# Patient Record
Sex: Male | Born: 1950 | Race: White | Hispanic: Yes | Marital: Married | Smoking: Former smoker
Health system: Southern US, Community
[De-identification: ages and names within clinical notes are randomized; demographics above are authoritative.]

## PROBLEM LIST (undated history)

## (undated) HISTORY — PX: OTHER SURGICAL HISTORY: SHX169

---

## 2017-07-21 ENCOUNTER — Encounter (HOSPITAL_COMMUNITY): Payer: Self-pay

## 2017-07-21 ENCOUNTER — Emergency Department (HOSPITAL_COMMUNITY): Payer: No Typology Code available for payment source

## 2017-07-21 ENCOUNTER — Emergency Department (HOSPITAL_COMMUNITY)
Admission: EM | Admit: 2017-07-21 | Discharge: 2017-07-21 | Disposition: A | Discharge status: Home / Self Care | Payer: No Typology Code available for payment source | Attending: Emergency Medicine | Admitting: Emergency Medicine

## 2017-07-21 DIAGNOSIS — Y999 Unspecified external cause status: Secondary | ICD-10-CM | POA: Insufficient documentation

## 2017-07-21 DIAGNOSIS — S40012A Contusion of left shoulder, initial encounter: Secondary | ICD-10-CM | POA: Diagnosis not present

## 2017-07-21 DIAGNOSIS — Y92411 Interstate highway as the place of occurrence of the external cause: Secondary | ICD-10-CM | POA: Insufficient documentation

## 2017-07-21 DIAGNOSIS — Y9389 Activity, other specified: Secondary | ICD-10-CM | POA: Diagnosis not present

## 2017-07-21 DIAGNOSIS — N281 Cyst of kidney, acquired: Secondary | ICD-10-CM | POA: Diagnosis not present

## 2017-07-21 DIAGNOSIS — Z23 Encounter for immunization: Secondary | ICD-10-CM | POA: Diagnosis not present

## 2017-07-21 DIAGNOSIS — R079 Chest pain, unspecified: Secondary | ICD-10-CM

## 2017-07-21 DIAGNOSIS — S2222XA Fracture of body of sternum, initial encounter for closed fracture: Secondary | ICD-10-CM | POA: Insufficient documentation

## 2017-07-21 DIAGNOSIS — S0990XA Unspecified injury of head, initial encounter: Secondary | ICD-10-CM | POA: Diagnosis not present

## 2017-07-21 DIAGNOSIS — S299XXA Unspecified injury of thorax, initial encounter: Secondary | ICD-10-CM | POA: Diagnosis present

## 2017-07-21 LAB — COMPREHENSIVE METABOLIC PANEL
ALBUMIN: 4.1 g/dL (ref 3.5–5.0)
ALT: 46 U/L (ref 17–63)
ANION GAP: 10 (ref 5–15)
AST: 55 U/L — ABNORMAL HIGH (ref 15–41)
Alkaline Phosphatase: 51 U/L (ref 38–126)
BILIRUBIN TOTAL: 1.7 mg/dL — AB (ref 0.3–1.2)
BUN: 17 mg/dL (ref 6–20)
CO2: 26 mmol/L (ref 22–32)
Calcium: 9.3 mg/dL (ref 8.9–10.3)
Chloride: 106 mmol/L (ref 101–111)
Creatinine, Ser: 0.97 mg/dL (ref 0.61–1.24)
GFR calc Af Amer: >60 mL/min (ref >60)
GFR calc non Af Amer: >60 mL/min (ref >60)
GLUCOSE: 135 mg/dL — AB (ref 65–99)
POTASSIUM: 4.1 mmol/L (ref 3.5–5.1)
Sodium: 142 mmol/L (ref 135–145)
TOTAL PROTEIN: 6.9 g/dL (ref 6.5–8.1)

## 2017-07-21 LAB — CBC
HEMATOCRIT: 45.7 % (ref 39.0–52.0)
Hemoglobin: 15.6 g/dL (ref 13.0–17.0)
MCH: 28.6 pg (ref 26.0–34.0)
MCHC: 34.1 g/dL (ref 30.0–36.0)
MCV: 83.7 fL (ref 78.0–100.0)
Platelets: 235 10*3/uL (ref 150–400)
RBC: 5.46 MIL/uL (ref 4.22–5.81)
RDW: 13.5 % (ref 11.5–15.5)
WBC: 8.9 10*3/uL (ref 4.0–10.5)

## 2017-07-21 LAB — I-STAT TROPONIN, ED: TROPONIN I, POC: 0 ng/mL (ref 0.00–0.08)

## 2017-07-21 MED ORDER — OXYCODONE-ACETAMINOPHEN 5-325 MG PO TABS: 1.0000 | ORAL_TABLET | ORAL | 0 refills | Status: DC | PRN

## 2017-07-21 MED ORDER — MORPHINE SULFATE (PF) 4 MG/ML IV SOLN
2.0000 mg | Freq: Once | INTRAVENOUS | Status: AC
  Administered 2017-07-21: 2 mg via INTRAVENOUS
  Filled 2017-07-21: qty 1

## 2017-07-21 MED ORDER — METHOCARBAMOL 500 MG PO TABS: 500.0000 mg | ORAL_TABLET | Freq: Two times a day (BID) | ORAL | 0 refills | Status: DC

## 2017-07-21 MED ORDER — TETANUS-DIPHTH-ACELL PERTUSSIS 5-2.5-18.5 LF-MCG/0.5 IM SUSP
0.5000 mL | Freq: Once | INTRAMUSCULAR | Status: AC
  Administered 2017-07-21: 0.5 mL via INTRAMUSCULAR
  Filled 2017-07-21: qty 0.5

## 2017-07-21 MED ORDER — OXYCODONE-ACETAMINOPHEN 5-325 MG PO TABS
1.0000 | ORAL_TABLET | Freq: Once | ORAL | Status: AC
  Administered 2017-07-21: 1 via ORAL
  Filled 2017-07-21: qty 1

## 2017-07-21 MED ORDER — IOPAMIDOL (ISOVUE-300) INJECTION 61%
INTRAVENOUS | Status: AC
  Administered 2017-07-21: 100 mL
  Filled 2017-07-21: qty 100

## 2017-07-21 MED ORDER — SODIUM CHLORIDE 0.9 % IV BOLUS (SEPSIS)
500.0000 mL | Freq: Once | INTRAVENOUS | Status: AC
  Administered 2017-07-21: 500 mL via INTRAVENOUS
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
## 2017-07-21 NOTE — ED Notes (Signed)
Patient has returned back from CT 
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
## 2017-07-21 NOTE — ED Notes (Signed)
Incentive spirometer given to pt and educated on same
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
## 2017-07-21 NOTE — ED Notes (Signed)
Pt given urinal, pts wife called to bedside to help.
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
## 2017-07-21 NOTE — ED Notes (Signed)
Pt walks with steady gait but does c/o chest pain with moivment. Pt did wash hands remoiving glass before returning to bed.

## 2017-11-09 ENCOUNTER — Encounter: Payer: No Typology Code available for payment source | Admitting: Diagnostic Neuroimaging

## 2017-11-09 ENCOUNTER — Ambulatory Visit (INDEPENDENT_AMBULATORY_CARE_PROVIDER_SITE_OTHER): Payer: BLUE CROSS/BLUE SHIELD | Admitting: Diagnostic Neuroimaging

## 2017-11-09 ENCOUNTER — Encounter: Payer: BLUE CROSS/BLUE SHIELD | Admitting: Diagnostic Neuroimaging

## 2017-11-09 DIAGNOSIS — M79605 Pain in left leg: Secondary | ICD-10-CM | POA: Diagnosis not present

## 2017-11-09 DIAGNOSIS — M79604 Pain in right leg: Secondary | ICD-10-CM

## 2017-11-09 DIAGNOSIS — Z0289 Encounter for other administrative examinations: Secondary | ICD-10-CM

## 2017-11-10 NOTE — Procedures (Signed)
[facility]  NCS (NERVE CONDUCTION STUDY) WITH EMG (ELECTROMYOGRAPHY) REPORT   STUDY DATE: 11/09/17 PATIENT NAME: Joshua Wells DOB: 1951-10-16 MRN: [id number]  ORDERING CLINICIAN: Darryl LentAmanda Taylor, PA  TECHNOLOGIST: Charlesetta IvoryBeau Handy ELECTROMYOGRAPHER: Glenford BayleyVikram R. Sruti Ayllon, MD  CLINICAL INFORMATION: 66 year old male with lower extremity pain. Patient has lower extremity weakness with prolonged standing, which is relieved by sitting. Patient has severe spinal stenosis at L4-5 and L5-S1 on CT lumbar spine from July 2018.   FINDINGS: NERVE CONDUCTION STUDY: Right peroneal and bilateral tibial motor responses have normal distal latencies, decreased amplitude, normal conduction velocity and right peroneal nerve, mildly slow conduction velocities in the bilateral tibial nerves.  Left peroneal motor response is normal.  Bilateral tibial F wave latencies are slightly prolonged.  Bilateral sural and superficial peroneal sensory responses are normal.   NEEDLE ELECTROMYOGRAPHY: Needle examination of bilateral lower extremities shows decreased motor unit recruitment in right tibialis anterior, right gastrocnemius and left gastrocnemius muscles on exertion.  Positive sharp waves and fibrillation potentials noted in right gastrocnemius at rest.  Right vastus medialis is normal.  Bilateral lumbar paraspinal muscles are normal.   IMPRESSION:   Abnormal study demonstrating: - Electrodiagnostic evidence of right L5 and bilateral S1 radiculopathies (even though lumbar paraspinal muscles are unremarkable on needle EMG).   - Given the clinical context and CT lumbar spine findings from July 2018, patient's symptoms and electrodiagnostic findings are likely related to severe spinal stenosis at L4-5 and L5-S1.     INTERPRETING PHYSICIAN:  Suanne MarkerVIKRAM R. Rosaura Bolon, MD Certified in Neurology, Neurophysiology and Neuroimaging  [facility] [address], [address] [place], [place] [address] [phone]   [facility]    Nerve / Sites Muscle Latency Ref. Amplitude Ref. Rel Amp Segments Distance Velocity Ref. Area    ms ms mV mV %  cm m/s m/s mVms  R Peroneal - EDB     Ankle EDB 5.6 ?6.5 1.6 ?2.0 100 Ankle - EDB 9   4.0     Fib head EDB 12.0  1.4  85.9 Fib head - Ankle 28 44 ?44 3.8     Pop fossa EDB 14.3  0.8  56.1 Pop fossa - Fib head 10 44 ?44 2.4         Pop fossa - Ankle      L Peroneal - EDB     Ankle EDB 4.5 ?6.5 5.4 ?2.0 100 Ankle - EDB 9   16.4     Fib head EDB 10.2  4.2  78.3 Fib head - Ankle 27 48 ?44 14.1     Pop fossa EDB 12.4  4.0  94.2 Pop fossa - Fib head 10 45 ?44 13.2         Pop fossa - Ankle      R Tibial - AH     Ankle AH 4.2 ?5.8 3.8 ?4.0 100 Ankle - AH 9   7.8     Pop fossa AH 13.1  1.4  36.7 Pop fossa - Ankle 33 37 ?41 3.8  L Tibial - AH     Ankle AH 4.7 ?5.8 1.5 ?4.0 100 Ankle - AH 9   4.0     Pop fossa AH 13.9  2.0  128 Pop fossa - Ankle 33 36 ?41 4.4             SNC    Nerve / Sites Rec. Site Peak Lat Ref.  Amp Ref. Segments Distance    ms ms  V V  cm  R Sural - Ankle (Calf)     Calf Ankle 3.3 ?4.4 12 ?6 Calf - Ankle 14  L Sural - Ankle (Calf)     Calf Ankle 3.1 ?4.4 11 ?6 Calf - Ankle 14  R Superficial peroneal - Ankle     Lat leg Ankle 3.2 ?4.4 7 ?6 Lat leg - Ankle 14  L Superficial peroneal - Ankle     Lat leg Ankle 3.4 ?4.4 9 ?6 Lat leg - Ankle 14             F  Wave    Nerve F Lat Ref.   ms ms  R Tibial - AH 62.5 ?56.0  L Tibial - AH 58.0 ?56.0         EMG full       EMG Summary Table    Spontaneous MUAP Recruitment  Muscle IA Fib PSW Fasc Other Amp Dur. Poly Pattern  R. Vastus medialis Normal None None None _______ Normal Normal Normal Normal  R. Tibialis anterior Normal None None None _______ Normal Normal Normal Reduced  R. Gastrocnemius (Medial head) Normal 2+ 2+ None _______ Increased Normal Normal Reduced  L. Tibialis anterior Normal None None None _______ Normal Normal Normal Normal  L.  Gastrocnemius (Medial head) Increased None None None _______ Normal Normal Normal Reduced  R. Lumbar paraspinals Normal None None None _______ Normal Normal Normal Normal
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
## 2024-02-01 LAB — HM AWV

## 2024-02-01 LAB — PROTEIN / CREATININE RATIO, URINE
Albumin, U: 62.8
Creatinine, Urine: 97.5

## 2024-02-01 LAB — MICROALBUMIN / CREATININE URINE RATIO: Microalb Creat Ratio: 64

## 2024-07-19 LAB — COMPREHENSIVE METABOLIC PANEL WITH GFR
Albumin: 4.9 (ref 3.5–5.0)
Calcium: 9.3 (ref 8.7–10.7)
Globulin: 2.4
eGFR: 128

## 2024-07-19 LAB — LIPID PANEL
Cholesterol: 138 (ref 0–200)
HDL: 52 (ref 35–70)
LDL Cholesterol: 66
LDl/HDL Ratio: 20
Triglycerides: 98 (ref 40–160)

## 2024-07-19 LAB — BASIC METABOLIC PANEL WITH GFR
BUN: 14 (ref 4–21)
CO2: 27 — AB (ref 13–22)
Chloride: 100 (ref 99–108)
Creatinine: 0.6 (ref 0.6–1.3)
Glucose: 169
Potassium: 4 meq/L (ref 3.5–5.1)
Sodium: 138 (ref 137–147)

## 2024-11-13 ENCOUNTER — Ambulatory Visit (INDEPENDENT_AMBULATORY_CARE_PROVIDER_SITE_OTHER): Payer: Self-pay | Admitting: Student

## 2024-11-13 ENCOUNTER — Other Ambulatory Visit (HOSPITAL_BASED_OUTPATIENT_CLINIC_OR_DEPARTMENT_OTHER): Payer: Self-pay

## 2024-11-13 ENCOUNTER — Encounter (HOSPITAL_BASED_OUTPATIENT_CLINIC_OR_DEPARTMENT_OTHER): Payer: Self-pay | Admitting: Student

## 2024-11-13 VITALS — BP 162/61 | HR 76 | Temp 98.5°F | Resp 16 | Ht 63.19 in | Wt 197.9 lb

## 2024-11-13 DIAGNOSIS — Z7689 Persons encountering health services in other specified circumstances: Secondary | ICD-10-CM | POA: Diagnosis not present

## 2024-11-13 DIAGNOSIS — R030 Elevated blood-pressure reading, without diagnosis of hypertension: Secondary | ICD-10-CM

## 2024-11-13 DIAGNOSIS — S46212A Strain of muscle, fascia and tendon of other parts of biceps, left arm, initial encounter: Secondary | ICD-10-CM | POA: Diagnosis not present

## 2024-11-13 DIAGNOSIS — E119 Type 2 diabetes mellitus without complications: Secondary | ICD-10-CM | POA: Insufficient documentation

## 2024-11-13 MED ORDER — MELOXICAM 15 MG PO TABS
15.0000 mg | ORAL_TABLET | Freq: Every day | ORAL | 0 refills | Status: AC
  Filled 2024-11-13 (×2): qty 30, 30d supply, fill #0

## 2024-11-13 NOTE — Patient Instructions (Addendum)
 It was nice to see you today!   Immobilize the injured arm in a sling with the elbow at 90 degrees of flexion.[1-2]  Apply ice to the affected area and prescribe oral analgesics for pain management.[1-2]  If you have any problems before your next visit feel free to message me via MyChart (minor issues or questions) or call the office, otherwise you may reach out to schedule an office visit.  Thank you! Myriah Boggus, PA-C
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
## 2024-11-14 ENCOUNTER — Other Ambulatory Visit (HOSPITAL_BASED_OUTPATIENT_CLINIC_OR_DEPARTMENT_OTHER): Payer: Self-pay

## 2024-11-19 ENCOUNTER — Encounter (HOSPITAL_BASED_OUTPATIENT_CLINIC_OR_DEPARTMENT_OTHER): Payer: Self-pay

## 2024-11-19 ENCOUNTER — Telehealth (HOSPITAL_BASED_OUTPATIENT_CLINIC_OR_DEPARTMENT_OTHER): Payer: Self-pay

## 2024-11-19 NOTE — Telephone Encounter (Signed)
 Called pt to advised we received a message from [facility] asking him to call them back at [phone]. Pt stated he already has an appt at the [facility] location on 11/25/2024. Number was fixed on his chart. We had a number off from what he wrote on DPR.

## 2024-12-25 ENCOUNTER — Ambulatory Visit (HOSPITAL_BASED_OUTPATIENT_CLINIC_OR_DEPARTMENT_OTHER): Admitting: Student

## 2024-12-25 ENCOUNTER — Encounter (HOSPITAL_BASED_OUTPATIENT_CLINIC_OR_DEPARTMENT_OTHER): Payer: Self-pay

## 2024-12-25 ENCOUNTER — Encounter (HOSPITAL_BASED_OUTPATIENT_CLINIC_OR_DEPARTMENT_OTHER): Payer: Self-pay | Admitting: Student

## 2024-12-25 VITALS — BP 168/88 | HR 66 | Temp 97.6°F | Resp 16 | Ht 63.19 in | Wt 195.8 lb

## 2024-12-25 DIAGNOSIS — E119 Type 2 diabetes mellitus without complications: Secondary | ICD-10-CM | POA: Diagnosis not present

## 2024-12-25 DIAGNOSIS — Z136 Encounter for screening for cardiovascular disorders: Secondary | ICD-10-CM | POA: Diagnosis not present

## 2024-12-25 DIAGNOSIS — M5416 Radiculopathy, lumbar region: Secondary | ICD-10-CM | POA: Diagnosis not present

## 2024-12-25 DIAGNOSIS — R030 Elevated blood-pressure reading, without diagnosis of hypertension: Secondary | ICD-10-CM

## 2024-12-25 DIAGNOSIS — Z13220 Encounter for screening for lipoid disorders: Secondary | ICD-10-CM | POA: Diagnosis not present

## 2024-12-25 DIAGNOSIS — Z0000 Encounter for general adult medical examination without abnormal findings: Secondary | ICD-10-CM | POA: Diagnosis not present

## 2024-12-25 NOTE — Patient Instructions (Signed)
 It was nice to see you today!  As we discussed in clinic:  Things to do to keep yourself healthy! - Exercise at least 30-45 minutes a day, 3-4 days a week.  - Eat a low-fat diet with lots of fruits and vegetables, up to 7-9 servings per day.  - Seatbelts can save your life. Wear them always.  - Smoke detectors on every level of your home, check batteries every year.  - Eye Doctor: have an eye exam every 1-2 years, even if you do not wear glasses or contacts. - Safe sex: if you may be exposed to STDs, use a condom.  - Alcohol: If you drink, do it moderately, less than 2 drinks per day.  - Health Care Power of Attorney: choose someone to speak for you if you are not able.  - Depression and anxiety are common in our stressful world.If you're feeling stressed, down, or like you're losing interest in things you normally enjoy, please come in for a visit.  - Violence: If anyone is threatening or hurting you, please call immediately.  Everyone deserves to be safe and loved in all of the relationships.   If you have any problems before your next visit feel free to message me via MyChart (minor issues or questions) or call the office, otherwise you may reach out to schedule an office visit.  Thank you! Natasha Burda, PA-C

## 2024-12-25 NOTE — Progress Notes (Signed)
 "  Complete physical exam  Patient: Joshua Wells   DOB: 10/11/1951   73 y.o. Male  MRN: [id number]  Subjective:    Chief Complaint  Patient presents with   Annual Exam    Annual exam.    Leg Pain    Left pain has been having needle-like pain. It started yesterday. Tingling from left hip down to leg.   Medical Management of Chronic Issues    Follow up. Still having left shoulder pain even when holding light things like ice cream. Has to do exercises and pain eases off after shoulder joint pops. Had some X-rays this month with PT. Has not heard back.    Discussed the use of AI scribe software for clinical note transcription with the patient, who gave verbal consent to proceed.  History of Present Illness   Joshua Wells is a 73 year old male who presents for an annual physical exam.  He experiences ongoing shoulder pain, particularly when holding objects like ice cream, which exacerbates the discomfort. Rotating his arm until it pops provides some relief. He has not been prescribed any medication specifically for this issue but has been using meloxicam , which he finds not very effective.  He experiences low back pain, especially when in bed, with nerve pain radiating down his leg, described as 'electric' in nature. Stretching exercises provide some relief.  He has lost weight211 pounds to 195 pounds, attributed to dietary changes, including reduced red meat consumption and increased intake of beans and salads. He avoids flour-based foods and drinks only water and coffee.  He has a family history of ALS, with a sister who passed away from the disease and a brother currently affected. He also notes a niece with severe diabetes leading to blindness.  He has not visited a dentist recently due to cost concerns but acknowledges the need for a dental check-up. He received one shingles vaccine in 2019 but has not completed the series and has not received a pneumonia vaccine.      Most  recent fall risk assessment:    12/25/2024    9:13 AM  Fall Risk   Falls in the past year? 0  Number falls in past yr: 0  Injury with Fall? 0  Risk for fall due to : No Fall Risks  Follow up Falls evaluation completed;Education provided;Falls prevention discussed     Most recent depression screenings:    11/13/2024    1:46 PM  PHQ 2/9 Scores  PHQ - 2 Score 0  PHQ- 9 Score 4   Patient Active Problem List   Diagnosis Date Noted   Type 2 diabetes mellitus without complication, without long-term current use of insulin (HCC) 11/13/2024   History reviewed. No pertinent past medical history. Social History[1] Allergies[2]    Patient Care Team: Kc Sedlak T, PA-C as PCP - General (Physician Assistant)   Show/hide medication list[3]  ROS  Per HPI     Objective:     BP (!) 168/88 (BP Location: Right Arm, Patient Position: Sitting, Cuff Size: Normal)   Pulse 66   Temp 97.6 F (36.4 C) (Oral)   Resp 16   Ht 5' 3.19 (1.605 m)   Wt 195 lb 12.8 oz (88.8 kg)   SpO2 95%   BMI 34.48 kg/m  BP Readings from Last 3 Encounters:  12/25/24 (!) 168/88  11/13/24 (!) 162/61  07/21/17 (!) 144/87   Wt Readings from Last 3 Encounters:  12/25/24 195 lb 12.8 oz (88.8 kg)  11/13/24 197 lb 14.4 oz (89.8 kg)  07/21/17 200 lb (90.7 kg)   SpO2 Readings from Last 3 Encounters:  12/25/24 95%  11/13/24 95%  07/21/17 92%      Physical Exam Constitutional:      General: He is not in acute distress.    Appearance: Normal appearance. He is not ill-appearing or diaphoretic.  HENT:     Head: Normocephalic and atraumatic.     Right Ear: Tympanic membrane, ear canal and external ear normal.     Left Ear: Tympanic membrane, ear canal and external ear normal.     Nose: Nose normal.     Mouth/Throat:     Mouth: Mucous membranes are moist.     Pharynx: Oropharynx is clear.  Eyes:     General: No scleral icterus.       Right eye: No discharge.        Left eye: No discharge.      Extraocular Movements: Extraocular movements intact.     Conjunctiva/sclera: Conjunctivae normal.     Pupils: Pupils are equal, round, and reactive to light.  Neck:     Thyroid: No thyroid mass, thyromegaly or thyroid tenderness.     Vascular: No carotid bruit.  Cardiovascular:     Rate and Rhythm: Normal rate and regular rhythm.     Pulses: Normal pulses.          Dorsalis pedis pulses are 2+ on the right side and 2+ on the left side.       Posterior tibial pulses are 2+ on the right side and 2+ on the left side.     Heart sounds: Normal heart sounds. No murmur heard.    No friction rub. No gallop.  Pulmonary:     Effort: Pulmonary effort is normal. No respiratory distress.     Breath sounds: Normal breath sounds. No wheezing, rhonchi or rales.  Chest:     Chest wall: No tenderness.  Abdominal:     General: Bowel sounds are normal. There is no distension.     Palpations: Abdomen is soft.     Tenderness: There is no abdominal tenderness. There is no guarding.  Musculoskeletal:        General: No swelling, deformity or signs of injury. Normal range of motion.     Cervical back: Neck supple.     Right lower leg: No edema.     Left lower leg: No edema.     Right foot: No Charcot foot.     Left foot: No Charcot foot.     Comments: SLR positive on BLLE.  Feet:     Right foot:     Protective Sensation: 9 sites tested.  7 sites sensed.     Skin integrity: Dry skin present.     Left foot:     Protective Sensation: 9 sites tested.  6 sites sensed.     Skin integrity: Dry skin present.     Toenail Condition: Left toenails are abnormally thick.  Lymphadenopathy:     Cervical: No cervical adenopathy.     Right cervical: No superficial or posterior cervical adenopathy.    Left cervical: No superficial cervical adenopathy.  Skin:    General: Skin is warm and dry.     Coloration: Skin is not jaundiced or pale.     Findings: No rash.  Neurological:     General: No focal deficit  present.     Mental Status: He is alert and oriented to  person, place, and time.     Motor: No weakness.  Psychiatric:        Mood and Affect: Mood normal.        Behavior: Behavior normal.      No results found for any visits on 12/25/24. Last CBC Lab Results  Component Value Date   WBC 8.9 07/21/2017   HGB 15.6 07/21/2017   HCT 45.7 07/21/2017   MCV 83.7 07/21/2017   MCH 28.6 07/21/2017   RDW 13.5 07/21/2017   PLT 235 07/21/2017   Last metabolic panel Lab Results  Component Value Date   GLUCOSE 135 (H) 07/21/2017   NA 138 07/19/2024   K 4.0 07/19/2024   CL 100 07/19/2024   CO2 27 (A) 07/19/2024   BUN 14 07/19/2024   CREATININE 0.6 07/19/2024   EGFR 128 07/19/2024   CALCIUM 9.3 07/19/2024   PROT 6.9 07/21/2017   ALBUMIN 4.9 07/19/2024   BILITOT 1.7 (H) 07/21/2017   ALKPHOS 51 07/21/2017   AST 55 (H) 07/21/2017   ALT 46 07/21/2017   ANIONGAP 10 07/21/2017   Last lipids Lab Results  Component Value Date   CHOL 138 07/19/2024   HDL 52 07/19/2024   LDLCALC 66 07/19/2024   TRIG 98 07/19/2024   Last hemoglobin A1c No results found for: HGBA1C      Assessment & Plan:    Routine Health Maintenance and Physical Exam  Health Maintenance  Topic Date Due   Hemoglobin A1C  Never done   Eye exam for diabetics  Never done   Hepatitis C Screening  Never done   Pneumococcal Vaccine for age over 31 (1 of 2 - PCV) Never done   Colon Cancer Screening  Never done   Zoster (Shingles) Vaccine (2 of 2) 04/03/2018   Medicare Annual Wellness Visit  01/31/2025   COVID-19 Vaccine (2 - 2025-26 season) 01/10/2025*   Flu Shot  03/25/2025*   Yearly kidney health urinalysis for diabetes  01/31/2025   Yearly kidney function blood test for diabetes  07/19/2025   Complete foot exam   12/25/2025   DTaP/Tdap/Td vaccine (2 - Td or Tdap) 07/22/2027   Meningitis B Vaccine  Aged Out  *Topic was postponed. The date shown is not the original due date.   Assessment and Plan     Annual Exam Due for shingles and pneumonia vaccines. Last colonoscopy was 3-4 years ago with a 10-year follow-up recommendation. No recent dental visit. - Recommended pneumonia and shingles vaccine at pharmacy- denies today. - Schedule dental visit for cleaning. - Ensure colonoscopy follow-up as per previous recommendation.   Type 2 diabetes mellitus Recent weight loss from 211 lbs to 195 lbs. Improved diet with reduced red meat intake and increased consumption of beans, salads, and water. Regular eye exams conducted. - Continue current dietary modifications. - Continue regular eye exams.  Lumbar radiculopathy Pain radiating down the leg, likely due to nerve impingement. Symptoms include electric-like pain and loss of sensation in the feet. No significant swelling. Symptoms may improve within six weeks with rest and activity modification. - Continue meloxicam  as needed for pain management. - Consider muscle relaxer at night if needed. - Return for evaluation if symptoms persist beyond six weeks for potential physical therapy.  Elevated BP Elevated blood pressure during the visit. No history of hypertension or symptoms such as chest pain or shortness of breath. - Monitor blood pressure at home twice daily for two weeks. - Return for nurse visit in six weeks to  review blood pressure readings. - Bring home blood pressure cuff to next visit for accuracy check.  Return in about 6 months (around 06/24/2025), or if symptoms worsen or fail to improve, for DM.     Lang DASEN Treyton Slimp, PA-C       [1]  Social History Tobacco Use   Smoking status: Former    Types: Gaffer exposure: Never   Smokeless tobacco: Never  Vaping Use   Vaping status: Never Used  Substance Use Topics   Alcohol use: No    Comment: QUIT AROUND 2013   Drug use: No  [2] No Known Allergies [3]  Outpatient Medications Prior to Visit  Medication Sig   gabapentin (NEURONTIN) 300 MG capsule Take 300 mg by  mouth 2 (two) times daily. (Patient taking differently: Take 300 mg by mouth daily.)   glipiZIDE (GLUCOTROL) 5 MG tablet Take 5 mg by mouth daily.   meloxicam  (MOBIC ) 15 MG tablet Take 1 tablet (15 mg total) by mouth daily.   tadalafil (CIALIS) 20 MG tablet Take 20 mg by mouth daily as needed.   Vitamin D, Ergocalciferol, (DRISDOL) 1.25 MG (50000 UNIT) CAPS capsule Take 50,000 Units by mouth once a week.   No facility-administered medications prior to visit.   "

## 2024-12-26 LAB — COMPREHENSIVE METABOLIC PANEL WITH GFR
ALT: 40 IU/L (ref 0–44)
AST: 27 IU/L (ref 0–40)
Albumin: 4.5 g/dL (ref 3.8–4.8)
Alkaline Phosphatase: 71 IU/L (ref 47–123)
BUN/Creatinine Ratio: 25 — ABNORMAL HIGH (ref 10–24)
BUN: 17 mg/dL (ref 8–27)
Bilirubin Total: 1.6 mg/dL — ABNORMAL HIGH (ref 0.0–1.2)
CO2: 24 mmol/L (ref 20–29)
Calcium: 9.5 mg/dL (ref 8.6–10.2)
Chloride: 100 mmol/L (ref 96–106)
Creatinine, Ser: 0.69 mg/dL — ABNORMAL LOW (ref 0.76–1.27)
Globulin, Total: 2.8 g/dL (ref 1.5–4.5)
Glucose: 200 mg/dL — ABNORMAL HIGH (ref 70–99)
Potassium: 4.6 mmol/L (ref 3.5–5.2)
Sodium: 138 mmol/L (ref 134–144)
Total Protein: 7.3 g/dL (ref 6.0–8.5)
eGFR: 99 mL/min/1.73

## 2024-12-26 LAB — CBC WITH DIFFERENTIAL/PLATELET
Basophils Absolute: 0.1 x10E3/uL (ref 0.0–0.2)
Basos: 1 %
EOS (ABSOLUTE): 0.2 x10E3/uL (ref 0.0–0.4)
Eos: 3 %
Hematocrit: 50.5 % (ref 37.5–51.0)
Hemoglobin: 16.6 g/dL (ref 13.0–17.7)
Immature Grans (Abs): 0 x10E3/uL (ref 0.0–0.1)
Immature Granulocytes: 0 %
Lymphocytes Absolute: 1.8 x10E3/uL (ref 0.7–3.1)
Lymphs: 28 %
MCH: 28.4 pg (ref 26.6–33.0)
MCHC: 32.9 g/dL (ref 31.5–35.7)
MCV: 86 fL (ref 79–97)
Monocytes Absolute: 0.5 x10E3/uL (ref 0.1–0.9)
Monocytes: 8 %
Neutrophils Absolute: 3.7 x10E3/uL (ref 1.4–7.0)
Neutrophils: 60 %
Platelets: 286 x10E3/uL (ref 150–450)
RBC: 5.85 x10E6/uL — ABNORMAL HIGH (ref 4.14–5.80)
RDW: 12.9 % (ref 11.6–15.4)
WBC: 6.3 x10E3/uL (ref 3.4–10.8)

## 2024-12-26 LAB — LIPID PANEL
Chol/HDL Ratio: 3 ratio (ref 0.0–5.0)
Cholesterol, Total: 157 mg/dL (ref 100–199)
HDL: 52 mg/dL
LDL Chol Calc (NIH): 83 mg/dL (ref 0–99)
Triglycerides: 121 mg/dL (ref 0–149)
VLDL Cholesterol Cal: 22 mg/dL (ref 5–40)

## 2024-12-26 LAB — HEMOGLOBIN A1C
Est. average glucose Bld gHb Est-mCnc: 232 mg/dL
Hgb A1c MFr Bld: 9.7 % — ABNORMAL HIGH (ref 4.8–5.6)

## 2024-12-27 ENCOUNTER — Ambulatory Visit (HOSPITAL_BASED_OUTPATIENT_CLINIC_OR_DEPARTMENT_OTHER): Payer: Self-pay | Admitting: Student

## 2024-12-27 DIAGNOSIS — R17 Unspecified jaundice: Secondary | ICD-10-CM

## 2024-12-30 ENCOUNTER — Ambulatory Visit: Payer: Self-pay

## 2024-12-30 NOTE — Telephone Encounter (Signed)
 FYI Only or Action Required?: FYI only for provider: appointment scheduled on 12/31/24.  Patient was last seen in primary care on 12/25/2024 by Rothfuss, Lang DASEN, PA-C.  Called Nurse Triage reporting Hypertension.  Symptoms began a week ago.  Interventions attempted: Nothing.  Symptoms are: unchanged.  Triage Disposition: See PCP When Office is Open (Within 3 Days)  Patient/caregiver understands and will follow disposition?: Yes    Copied from CRM #[id number]. Topic: Clinical - Red Word Triage >> Dec 30, 2024 11:03 AM Wess RAMAN wrote: Red Word that prompted transfer to Nurse Triage:  High Blood Pressure. 194/91 this morning. Headaches  Needs 3 month follow up (mid March) and 1 month follow up to recheck bilirubin for labs.    Reason for Disposition  Systolic BP >= 160 OR Diastolic >= 100  Answer Assessment - Initial Assessment Questions Pt called in returning call to clinic, per chart relayed information,  Please call: Labs overall are looking okay except his A1c came back extremely elevated at 9.7.  This indicates that his diabetes is extremely poorly controlled on his current dose of glipizide.  I believe he said that he had never been on metformin ?  Would it be okay if I sent in metformin  for him?  If so, I will give him specific instructions on how to increase the dose to where it would be most helpful for him.  We will also need to make his follow-up a bit sooner in light of these results, I will need to see him at 3 months instead of 6 months-this means he should see me around mid-March.   His bilirubin was also mildly elevated which we will need to recheck in around about a month as long as he is not having any abdominal pain.  I have placed the order-please schedule lab visit.   Pt is agreeable to try metformin . Pt also agreeable to changing visit to 3 months. Pt also reported elevated BP and h/a since New Year.  12/26/24 118/90  am   148/86 pm 01/02      180/101 am    173/99 pm  01/03     179/112 am    164/103pm  01/04    169/94 am       165/99 pm  01/05     156/95 am   Pt denies any dizziness, changes in vision, chest pain, SOB. Pt reports minimal h/a and fatigue. Pt also reports heartburn with belching. Based on questions regarding metformin  and elevated BP. Recommended appt with PCP. Appointment scheduled for evaluation. Patient agrees with plan of care, and will call back if anything changes, or if symptoms worsen.     1. BLOOD PRESSURE: What is your blood pressure? Did you take at least two measurements 5 minutes apart?     This morning, 156/95  2. ONSET: When did you take your blood pressure?     This morning; L arm   3. HOW: How did you take your blood pressure? (e.g., automatic home BP monitor, visiting nurse)     Automatic home BP monitor   4. HISTORY: Do you have a history of high blood pressure?     Hx of HTN   5. MEDICINES: Are you taking any medicines for blood pressure? Have you missed any doses recently?     No medications per pt d/t weight loss  6. OTHER SYMPTOMS: Do you have any symptoms? (e.g., blurred vision, chest pain, difficulty breathing, headache, weakness)     H/a  Protocols used: Blood Pressure - High-A-AH

## 2024-12-31 ENCOUNTER — Encounter (HOSPITAL_BASED_OUTPATIENT_CLINIC_OR_DEPARTMENT_OTHER): Payer: Self-pay | Admitting: Student

## 2024-12-31 ENCOUNTER — Ambulatory Visit (INDEPENDENT_AMBULATORY_CARE_PROVIDER_SITE_OTHER): Admitting: Student

## 2024-12-31 ENCOUNTER — Other Ambulatory Visit (HOSPITAL_BASED_OUTPATIENT_CLINIC_OR_DEPARTMENT_OTHER): Payer: Self-pay

## 2024-12-31 VITALS — BP 169/99 | HR 65 | Temp 98.1°F | Resp 16 | Ht 63.19 in | Wt 197.6 lb

## 2024-12-31 DIAGNOSIS — E119 Type 2 diabetes mellitus without complications: Secondary | ICD-10-CM

## 2024-12-31 DIAGNOSIS — I10 Essential (primary) hypertension: Secondary | ICD-10-CM | POA: Diagnosis not present

## 2024-12-31 MED ORDER — METFORMIN HCL ER 500 MG PO TB24
ORAL_TABLET | ORAL | 3 refills | Status: AC
  Filled 2024-12-31: qty 180, 55d supply, fill #0

## 2024-12-31 MED ORDER — TELMISARTAN 20 MG PO TABS
20.0000 mg | ORAL_TABLET | Freq: Every day | ORAL | 3 refills | Status: AC
  Filled 2024-12-31: qty 30, 30d supply, fill #0

## 2024-12-31 NOTE — Patient Instructions (Addendum)
 It was nice to see you today!  As we discussed in clinic:  Metformin  Titration Schedule: Week 1: Take 1 tablet by mouth (500 mg) with breakfast daily. Week 2: Take 2 tablets by mouth (1000 mg) with breakfast daily. Week 3: Take 2 tablets by mouth (1000 mg) with breakfast daily and take 1 tablet by mouth (500 mg) with dinner daily. Week 4: Take 2 tablets by mouth (1000 mg) with breakfast daily and take 2 tablet by mouth (1000 mg) with dinner daily.  You will start micardis  dialy for your blood pressure. We will need to check your electrolytes in 2 weeks and you will see my nurse in 6 weeks for a blood pressure check.  If you have any problems before your next visit feel free to message me via MyChart (minor issues or questions) or call the office, otherwise you may reach out to schedule an office visit.  Thank you! Traniece Boffa, PA-C

## 2024-12-31 NOTE — Progress Notes (Signed)
 "  Established Patient Office Visit  Subjective   Patient ID: Joshua Wells, male    DOB: 06-20-1951  Age: 74 y.o. MRN: [id number]  Chief Complaint  Patient presents with   Hypertension    High Blood Pressure. 194/91 yesterday. BP readings: 12/26/24 118/90 am 148/86 pm, 01/02 180/101 am 173/99 pm , 01/03 179/112 am 164/103pm, 01/04 169/94 am 165/99 pm, and 01/05 156/95 am. Pt denies any dizziness, changes in vision, chest pain, SOB. Pt reports minimal h/a and fatigue. Pt also reports heartburn with belching.    Diabetes    Pt is agreeable to starting metformin     Discussed the use of AI scribe software for clinical note transcription with the patient, who gave verbal consent to proceed.  History of Present Illness   Joshua Wells is a 74 year old male with uncontrolled diabetes and hypertension who presents for management of these conditions.  He has uncontrolled diabetes with a recent A1c of 9.7%. His dietary habits include a preference for flour tortillas, biscuits, and bread. He is trying to cut down on rice and incorporate more chicken soup with vegetables into his diet. He is currently taking glipizide.  He experiences consistently elevated blood pressure, which he attributes partly to stress from interactions at church and home. He monitors his blood pressure at home, often with the help of his granddaughter.      Patient Active Problem List   Diagnosis Date Noted   Lumbar radiculopathy 12/25/2024   Type 2 diabetes mellitus without complication, without long-term current use of insulin (HCC) 11/13/2024   History reviewed. No pertinent past medical history. Social History[1] Allergies[2]    ROS Per HPI.    Objective:     BP (!) 169/99   Pulse 65   Temp 98.1 F (36.7 C) (Oral)   Resp 16   Ht 5' 3.19 (1.605 m)   Wt 197 lb 9.6 oz (89.6 kg)   SpO2 96%   BMI 34.79 kg/m  BP Readings from Last 3 Encounters:  12/31/24 (!) 169/99  12/25/24 (!) 168/88  11/13/24  (!) 162/61   Wt Readings from Last 3 Encounters:  12/31/24 197 lb 9.6 oz (89.6 kg)  12/25/24 195 lb 12.8 oz (88.8 kg)  11/13/24 197 lb 14.4 oz (89.8 kg)   SpO2 Readings from Last 3 Encounters:  12/31/24 96%  12/25/24 95%  11/13/24 95%      Physical Exam Constitutional:      General: He is not in acute distress.    Appearance: Normal appearance. He is not ill-appearing.  HENT:     Head: Normocephalic and atraumatic.     Right Ear: External ear normal.     Left Ear: External ear normal.     Nose: Nose normal.  Eyes:     Conjunctiva/sclera: Conjunctivae normal.  Cardiovascular:     Rate and Rhythm: Normal rate and regular rhythm.     Pulses: Normal pulses.     Heart sounds: Normal heart sounds. No murmur heard.    No friction rub.  Pulmonary:     Effort: Pulmonary effort is normal. No respiratory distress.     Breath sounds: Normal breath sounds. No wheezing, rhonchi or rales.  Skin:    General: Skin is warm and dry.     Coloration: Skin is not jaundiced or pale.  Neurological:     Mental Status: He is alert.  Psychiatric:        Mood and Affect: Mood normal.  Behavior: Behavior normal.      No results found for any visits on 12/31/24.  Last CBC Lab Results  Component Value Date   WBC 6.3 12/25/2024   HGB 16.6 12/25/2024   HCT 50.5 12/25/2024   MCV 86 12/25/2024   MCH 28.4 12/25/2024   RDW 12.9 12/25/2024   PLT 286 12/25/2024   Last metabolic panel Lab Results  Component Value Date   GLUCOSE 200 (H) 12/25/2024   NA 138 12/25/2024   K 4.6 12/25/2024   CL 100 12/25/2024   CO2 24 12/25/2024   BUN 17 12/25/2024   CREATININE 0.69 (L) 12/25/2024   EGFR 99 12/25/2024   CALCIUM 9.5 12/25/2024   PROT 7.3 12/25/2024   ALBUMIN 4.5 12/25/2024   LABGLOB 2.8 12/25/2024   BILITOT 1.6 (H) 12/25/2024   ALKPHOS 71 12/25/2024   AST 27 12/25/2024   ALT 40 12/25/2024   ANIONGAP 10 07/21/2017   Last lipids Lab Results  Component Value Date   CHOL 157  12/25/2024   HDL 52 12/25/2024   LDLCALC 83 12/25/2024   TRIG 121 12/25/2024   CHOLHDL 3.0 12/25/2024   Last hemoglobin A1c Lab Results  Component Value Date   HGBA1C 9.7 (H) 12/25/2024      The 10-year ASCVD risk score (Arnett DK, et al., 2019) is: 50.2%    Assessment & Plan:    Assessment and Plan    Type 2 diabetes mellitus Chronic, not at goal. Severely uncontrolled with an A1c of 9.7%, indicating a need for intervention to prevent progression to insulin therapy. Current management includes glipizide, but additional medication is required. - Initiated metformin  extended release with a titration schedule: 1 pill in the morning with breakfast in week 1, 2 pills in the morning with breakfast in week 2, 2 pills in the morning with breakfast and 1 pill at night with dinner in week 3, and 2 pills in the morning with breakfast and 2 pills at night with dinner in week 4. - Educated on potential gastrointestinal side effects of metformin  and advised to take with food. - Advised dietary modifications to reduce carbohydrate intake, including limiting flour tortillas, biscuits, and bread. - Will recheck A1c in 3 months to assess response to treatment. - Continue glipizide at night.  Primary hypertension Chronic, not at goal. Hypertension is elevated, likely exacerbated by stress from personal and community responsibilities. Current management is insufficient, necessitating pharmacological intervention. - Initiated telmisartan  (Micardis ) for blood pressure management. - Ordered lab tests in 2 weeks to monitor electrolytes. - Scheduled follow-up appointment in 6 weeks for blood pressure re-evaluation with nurse. - Advised to continue home blood pressure monitoring. - Discussed potential need for medication adjustment based on follow-up results.       Return in about 6 weeks (around 02/11/2025) for bp check with thalia.    Makalyn Lennox T Chinmayi Rumer, PA-C     [1]  Social History Tobacco Use    Smoking status: Former    Types: Cigars    Passive exposure: Never   Smokeless tobacco: Never  Vaping Use   Vaping status: Never Used  Substance Use Topics   Alcohol use: No    Comment: QUIT AROUND 2013   Drug use: No  [2] No Known Allergies  "

## 2025-01-14 ENCOUNTER — Ambulatory Visit (HOSPITAL_BASED_OUTPATIENT_CLINIC_OR_DEPARTMENT_OTHER)

## 2025-02-05 ENCOUNTER — Ambulatory Visit (HOSPITAL_BASED_OUTPATIENT_CLINIC_OR_DEPARTMENT_OTHER)

## 2025-02-11 ENCOUNTER — Ambulatory Visit (HOSPITAL_BASED_OUTPATIENT_CLINIC_OR_DEPARTMENT_OTHER)

## 2025-03-11 ENCOUNTER — Ambulatory Visit (HOSPITAL_BASED_OUTPATIENT_CLINIC_OR_DEPARTMENT_OTHER): Admitting: Student

## 2025-07-01 ENCOUNTER — Ambulatory Visit (HOSPITAL_BASED_OUTPATIENT_CLINIC_OR_DEPARTMENT_OTHER): Admitting: Student
